# Patient Record
Sex: Female | Born: 1975 | Hispanic: Yes | Marital: Single | Smoking: Never smoker
Health system: Southern US, Community
[De-identification: ages and names within clinical notes are randomized; demographics above are authoritative.]

## PROBLEM LIST (undated history)

## (undated) DIAGNOSIS — Z972 Presence of dental prosthetic device (complete) (partial): Secondary | ICD-10-CM

---

## 2004-10-13 ENCOUNTER — Ambulatory Visit: Payer: Self-pay | Admitting: Family Medicine

## 2005-01-02 ENCOUNTER — Ambulatory Visit: Payer: Self-pay | Admitting: Family Medicine

## 2005-03-10 ENCOUNTER — Inpatient Hospital Stay: Payer: Self-pay | Admitting: Obstetrics and Gynecology

## 2008-07-01 ENCOUNTER — Ambulatory Visit: Payer: Self-pay | Admitting: Certified Nurse Midwife

## 2014-07-07 DIAGNOSIS — N879 Dysplasia of cervix uteri, unspecified: Secondary | ICD-10-CM | POA: Insufficient documentation

## 2019-06-11 ENCOUNTER — Other Ambulatory Visit: Payer: Self-pay

## 2019-06-11 ENCOUNTER — Ambulatory Visit: Payer: Self-pay

## 2019-06-11 VITALS — BP 118/86 | HR 85 | Resp 16 | Ht 62.0 in | Wt 138.0 lb

## 2019-06-11 DIAGNOSIS — Z008 Encounter for other general examination: Secondary | ICD-10-CM

## 2019-06-11 LAB — POCT LIPID PANEL
HDL: 55
LDL: 1.7
Non-HDL: 115
POC Glucose: 107 mg/dl — AB (ref 70–99)
TC/HDL: 3.1
TC: 170
TRG: 114

## 2019-06-11 NOTE — Progress Notes (Signed)
     Patient ID: Phyllis Torres, female    DOB: 09-11-1976, 43 y.o.   MRN: [id number]    Thank you!!  [place] Nurse Specialist [place]: [phone]  Cell:  [phone] Website: [email or web address]

## 2019-12-03 ENCOUNTER — Ambulatory Visit: Payer: Self-pay | Attending: Internal Medicine

## 2019-12-03 DIAGNOSIS — Z23 Encounter for immunization: Secondary | ICD-10-CM

## 2019-12-03 NOTE — Progress Notes (Signed)
   Covid-19 Vaccination Clinic  Name:  Kissey Chou    MRN: [id number] DOB: 03-06-1976  12/03/2019  Ms. Jerilee Hoh was observed post Covid-19 immunization for 15 minutes without incident. She was provided with Vaccine Information Sheet and instruction to access the V-Safe system.   Ms. Jerilee Hoh was instructed to call 911 with any severe reactions post vaccine: Marland Kitchen Difficulty breathing  . Swelling of face and throat  . A fast heartbeat  . A bad rash all over body  . Dizziness and weakness   Immunizations Administered    Name Date Dose VIS Date Route   Pfizer COVID-19 Vaccine 12/03/2019 10:32 AM 0.3 mL 08/28/2019 Intramuscular   Manufacturer: [place]   Lot: [id number]   [place]: [id number]

## 2019-12-29 ENCOUNTER — Ambulatory Visit: Payer: Self-pay | Attending: Internal Medicine

## 2019-12-29 DIAGNOSIS — Z23 Encounter for immunization: Secondary | ICD-10-CM

## 2019-12-29 NOTE — Progress Notes (Signed)
   Covid-19 Vaccination Clinic  Name:  Phyllis Torres    MRN: [id number] DOB: 1975/10/06  12/29/2019  Ms. Phyllis Torres was observed post Covid-19 immunization for 15 minutes without incident. She was provided with Vaccine Information Sheet and instruction to access the V-Safe system.   Ms. Phyllis Torres was instructed to call 911 with any severe reactions post vaccine: Marland Kitchen Difficulty breathing  . Swelling of face and throat  . A fast heartbeat  . A bad rash all over body  . Dizziness and weakness   Immunizations Administered    Name Date Dose VIS Date Route   Pfizer COVID-19 Vaccine 12/29/2019 10:38 AM 0.3 mL 08/28/2019 Intramuscular   Manufacturer: [place]   Lot: [id number]   [place]: [id number]

## 2020-02-27 ENCOUNTER — Other Ambulatory Visit: Payer: Self-pay | Admitting: Physician Assistant

## 2020-02-27 DIAGNOSIS — Z3009 Encounter for other general counseling and advice on contraception: Secondary | ICD-10-CM

## 2021-04-10 ENCOUNTER — Other Ambulatory Visit: Payer: Self-pay | Admitting: Family Medicine

## 2021-04-10 DIAGNOSIS — Z1231 Encounter for screening mammogram for malignant neoplasm of breast: Secondary | ICD-10-CM

## 2021-07-17 ENCOUNTER — Other Ambulatory Visit: Payer: Self-pay

## 2021-07-17 ENCOUNTER — Ambulatory Visit
Admission: RE | Admit: 2021-07-17 | Discharge: 2021-07-17 | Disposition: A | Discharge status: Home / Self Care | Payer: BC Managed Care – PPO | Source: Ambulatory Visit | Attending: Family Medicine | Admitting: Family Medicine

## 2021-07-17 DIAGNOSIS — Z1231 Encounter for screening mammogram for malignant neoplasm of breast: Secondary | ICD-10-CM | POA: Diagnosis not present

## 2022-03-12 ENCOUNTER — Other Ambulatory Visit: Payer: Self-pay | Admitting: Obstetrics and Gynecology

## 2022-03-23 NOTE — H&P (Signed)
Phyllis Torres is a 46 y.o. female here for [facility] and bilateral salpingectomy  .   Here for follow up for menorrhagia for the past 3 months . On OCP which helps some , but she doesn't want to continue the pill .  U/S shows U/s shows 5x4 cm posterior uterine fibroid . And a simple 3 cm right cm ovarian cyst  She desires to proceed with definitive surgery    Past Medical History:  has no past medical history on file.  Past Surgical History:  has no past surgical history on file. Family History: family history includes Diabetes in her father, mother, and sister; Hyperlipidemia (Elevated cholesterol) in her brother. Social History:  reports that she has never smoked. She has never used smokeless tobacco. She reports that she does not currently use alcohol. She reports that she does not use drugs. OB/GYN History:  OB History       Gravida  3   Para  3   Term      Preterm      AB      Living  3        SAB      IAB      Ectopic      Molar      Multiple      Live Births  3             Allergies: is allergic to phentermine. Medications:   Current Outpatient Medications:    etonogestreL (NEXPLANON) 68 mg implant, Inject 1 each into the skin once, Disp: , Rfl:    norgestimate-ethinyl estradiol triphasic (ORTHO TRI-CYCLEN LO) 0.18/0.215/0.25 mg-25 mcg tablet, Take 1 tablet by mouth once daily, Disp: 28 tablet, Rfl: 3   Review of Systems: General:                      No fatigue or weight loss Eyes:                           No vision changes Ears:                            No hearing difficulty Respiratory:                No cough or shortness of breath Pulmonary:                  No asthma or shortness of breath Cardiovascular:           No chest pain, palpitations, dyspnea on exertion Gastrointestinal:          No abdominal bloating, chronic diarrhea, constipations, masses, pain or hematochezia Genitourinary:             No hematuria, dysuria, abnormal vaginal  discharge, pelvic pain, +Menometrorrhagia Lymphatic:                   No swollen lymph nodes Musculoskeletal:         No muscle weakness Neurologic:                  No extremity weakness, syncope, seizure disorder Psychiatric:                  No history of depression, delusions or suicidal/homicidal ideation      Exam:       Vitals:  03/26/22  BP: 137/77  Pulse: 94      Body mass index is 27.8 kg/m.   WDWN white/ female in NAD   Lungs: CTA  CV : RRR without murmur   Neck:  no thyromegaly Abdomen: soft , no mass, normal active bowel sounds,  non-tender, no rebound tenderness Pelvic: tanner stage 5 ,  External genitalia: vulva /labia no lesions Urethra: no prolapse Vagina: normal physiologic d/c adequate room for TVH  Cervix: no lesions, no cervical motion tenderness   Uterus: second degree descensus   9 week prominent posterior  normal size shape and contour, non-tender adnexa: no mass,  non-tender Impression:    The primary encounter diagnosis was Abnormal vaginal bleeding. Diagnoses of Intramural leiomyoma of uterus and Right ovarian cyst were also pertinent to this visit.       Plan:    After discussion with pt and translator ( "g") she has opted for definitive surgery . TVH and bilateral salpingectomy . Information sheets ( spanish ) given to pt       Risk of the surgery discussed with Pt see [facility] notes   Emet Rafanan Samuel [place], MD

## 2022-03-28 ENCOUNTER — Encounter: Payer: Self-pay | Admitting: *Deleted

## 2022-03-28 ENCOUNTER — Encounter
Admission: RE | Admit: 2022-03-28 | Discharge: 2022-03-28 | Disposition: A | Discharge status: Home / Self Care | Payer: BC Managed Care – PPO | Source: Ambulatory Visit | Attending: Obstetrics and Gynecology | Admitting: Obstetrics and Gynecology

## 2022-03-28 VITALS — Ht 62.0 in | Wt 152.0 lb

## 2022-03-28 DIAGNOSIS — Z01812 Encounter for preprocedural laboratory examination: Secondary | ICD-10-CM

## 2022-03-28 NOTE — Patient Instructions (Addendum)
Your procedure is scheduled on: Friday April 06, 2022. Su procedimiento est programado para: Viernes [place] 2023. Report to Day Surgery inside [facility] 2nd floor, stop by admissions desk before getting on elevator.  Presntese a: Science writer del [facility] 2ndo piso, registrese primero antes de subir al [employer]. To find out your arrival time please call [phone] between 1PM - 3PM on Thursday April 05, 2022. Para saber su hora de llegada por favor llame al [phone] Lyndal Pulley la 1PM - 3PM el da: Rudi Coco [address] [place] 2023.   Remember: Instructions that are not followed completely may result in serious medical risk, up to and including death,  or upon the discretion of your surgeon and anesthesiologist your surgery may need to be rescheduled.  Recuerde: Las instrucciones que no se siguen completamente Heritage manager en un riesgo de salud grave, incluyendo hasta  la [place] o a discrecin de su cirujano y Environmental health practitioner, su ciruga se puede posponer.   __X_ 1.Do not eat food after midnight the night before your procedure. No    gum chewing or hard candies. You may drink clear liquids up to 2 hours     before you are scheduled to arrive for your surgery- DO not drink clear     Liquids within 2 hours of the start of your surgery.     Clear Liquids include: Pre-surgery drink   water, Black Coffee or Tea (Do not add anything to coffee or tea).      No coma nada despus de la medianoche de la noche anterior a su    procedimiento. No coma chicles ni caramelos duros. Puede tomar    lquidos claros hasta 2 horas antes de su hora programada de llegada al     hospital para su procedimiento. No tome lquidos claros durante el     transcurso de las 2 horas de su llegada programada al hospital para su     procedimiento, ya que esto puede llevar a que su procedimiento se    retrase o tenga que volver a Health and safety inspector.  Los lquidos claros incluyen: [place] de pre-cirugia            - [place] o jugo de manzana sin pulpa          - Bebidas claras con carbohidratos como ClearFast o Gatorade          - Caf negro o t claro (sin leche, sin cremas, no agregue nada al caf ni al t)  No tome nada que no est en esta lista.  Los pacientes con diabetes tipo 1 y tipo 2 solo deben Agricultural engineer.  Llame a la clnica de PreCare o a la unidad de Same Day Surgery si  tiene alguna pregunta sobre estas instrucciones.              _X__ 2.Do Not Smoke or use e-cigarettes For 24 Hours Prior to Your Surgery.    Do not use any chewable tobacco products for at least 6   hours prior to surgery.    No fume ni use cigarrillos electrnicos durante las 24 horas previas    a su [place].  No use ningn producto de tabaco masticable durante   al menos 6 horas antes de la [place].     __X_ 3. No alcohol for 24 hours before or after surgery.    No tome alcohol durante las 24 horas antes ni despus de la [place].   __X__4. On the  morning of surgery brush your teeth with toothpaste and water, you                may rinse your mouth with mouthwash if you wish.  Do not swallow any toothpaste of mouthwash.   En la maana de la [place], cepllese los dientes con pasta de dientes y [place],                [place] enjuagarse la boca con enjuague bucal si lo desea. No ingiera ninguna pasta de dientes o enjuague bucal.   __X__ 5. Notify your doctor if there is any change in your medical condition (cold,fever, infections).    Informe a su mdico si hay algn cambio en su condicin mdica  (resfriado, fiebre, infecciones).   Do not wear jewelry, make-up, hairpins, clips or nail polish.  No use joyas, maquillajes, pinzas/ganchos para el cabello ni esmalte de uas.  Do not wear lotions, powders, or perfumes. You may wear deodorant.  No use lociones, polvos o perfumes.  Puede usar desodorante.    Do not shave 48 hours prior to surgery. Men may shave face and neck.  No se afeite 48 horas antes de la [place].   Los hombres pueden [employer] cara  y el cuello.   Do not bring valuables to the hospital.   No lleve objetos [employer] is not responsible for any belongings or valuables.  [place] no se hace responsable de ningn tipo de pertenencias u objetos de Geographical information systems officer.               Contacts, dentures or bridgework may not be worn into surgery.  Los lentes de [place], las dentaduras postizas o puentes no se pueden usar en la [place].   Leave your suitcase in the car. After surgery it may be brought to your room.  Deje su maleta en el auto.  Despus de la ciruga podr traerla a su habitacin.   For patients admitted to the hospital, discharge time is determined by your  treatment team.  Para los pacientes que sean ingresados al hospital, el tiempo en el cual se le  dar de alta es determinado por su equipo de [place].   Patients discharged the day of surgery will not be allowed to drive home. A los pacientes que se les da de alta el mismo da de la ciruga no se les permitir conducir a Holiday representative.  __X__ Take these medicines the morning of surgery with A SIP OF WATER:          [employer] estas medicinas la maana de la ciruga con UN SORBO DE AGUA:  1. None   2.   3.   4.       5.  6.  ____ Fleet Enema (as directed)          Enema de Fleet (segn lo indicado)    __X__ Use CHG Soap as directed          Utilice el jabn de CHG segn lo indicado  ____ Use inhalers on the day of surgery          Use los inhaladores el da de la ciruga  ____ Stop metformin 2 days prior to surgery          Deje de tomar el metformin 2 das antes de la ciruga    ____ Take 1/2 of usual insulin dose the night before surgery and none on the morning of surgery  Tome la mitad de la dosis habitual de insulina la noche antes de la [place] y no tome nada en la maana de la             ciruga  __X__ Stop Anti-inflammatories such as Ibuprofen, Aleve, Advil, Motrin, naprosyn,  Meloxicam, Lodine, Ketoralac, Midol, aspirin, Goody's and or BC powders.  Tylenol is OK          Deje de tomar antiinflamatorios como Ibuprofen, aleve, Advil, Motrin, naprosyn, Meloxicam, Lodine, Ketoralac, Midol, aspirin, Goody's and or BC powders. Tylenol is OK   __X__ Do not start any new vitamins and or supplements until after surgery            No empize a tomar vitaminas o suplementos hasta despus de la ciruga  ____ Bring C-Pap to the hospital          [place] al hospital

## 2022-03-30 ENCOUNTER — Encounter
Admission: RE | Admit: 2022-03-30 | Discharge: 2022-03-30 | Disposition: A | Discharge status: Home / Self Care | Payer: BC Managed Care – PPO | Source: Ambulatory Visit | Attending: Obstetrics and Gynecology | Admitting: Obstetrics and Gynecology

## 2022-03-30 DIAGNOSIS — Z01818 Encounter for other preprocedural examination: Secondary | ICD-10-CM

## 2022-03-30 DIAGNOSIS — Z01812 Encounter for preprocedural laboratory examination: Secondary | ICD-10-CM | POA: Insufficient documentation

## 2022-03-30 LAB — CBC
HCT: 39.6 % (ref 36.0–46.0)
Hemoglobin: 12.9 g/dL (ref 12.0–15.0)
MCH: 30.1 pg (ref 26.0–34.0)
MCHC: 32.6 g/dL (ref 30.0–36.0)
MCV: 92.5 fL (ref 80.0–100.0)
Platelets: 328 10*3/uL (ref 150–400)
RBC: 4.28 MIL/uL (ref 3.87–5.11)
RDW: 12.3 % (ref 11.5–15.5)
WBC: 7.6 10*3/uL (ref 4.0–10.5)
nRBC: 0 % (ref 0.0–0.2)

## 2022-03-30 LAB — BASIC METABOLIC PANEL
Anion gap: 4 — ABNORMAL LOW (ref 5–15)
BUN: 12 mg/dL (ref 6–20)
CO2: 27 mmol/L (ref 22–32)
Calcium: 8.9 mg/dL (ref 8.9–10.3)
Chloride: 106 mmol/L (ref 98–111)
Creatinine, Ser: 0.68 mg/dL (ref 0.44–1.00)
GFR, Estimated: >60 mL/min (ref >60)
Glucose, Bld: 133 mg/dL — ABNORMAL HIGH (ref 70–99)
Potassium: 4.1 mmol/L (ref 3.5–5.1)
Sodium: 137 mmol/L (ref 135–145)

## 2022-04-02 LAB — TYPE AND SCREEN
ABO/RH(D): O POS
Antibody Screen: NEGATIVE

## 2022-04-06 ENCOUNTER — Ambulatory Visit
Admission: RE | Admit: 2022-04-06 | Discharge: 2022-04-06 | Disposition: A | Discharge status: Home / Self Care | Payer: BC Managed Care – PPO | Source: Ambulatory Visit | Attending: Obstetrics and Gynecology | Admitting: Obstetrics and Gynecology

## 2022-04-06 ENCOUNTER — Encounter: Payer: Self-pay | Admitting: Obstetrics and Gynecology

## 2022-04-06 ENCOUNTER — Other Ambulatory Visit: Payer: Self-pay

## 2022-04-06 ENCOUNTER — Ambulatory Visit: Payer: BC Managed Care – PPO | Admitting: Urgent Care

## 2022-04-06 ENCOUNTER — Ambulatory Visit: Payer: BC Managed Care – PPO | Admitting: General Practice

## 2022-04-06 ENCOUNTER — Encounter
Admission: RE | Disposition: A | Discharge status: Home / Self Care | Payer: Self-pay | Source: Ambulatory Visit | Attending: Obstetrics and Gynecology

## 2022-04-06 DIAGNOSIS — N83201 Unspecified ovarian cyst, right side: Secondary | ICD-10-CM | POA: Diagnosis not present

## 2022-04-06 DIAGNOSIS — N72 Inflammatory disease of cervix uteri: Secondary | ICD-10-CM | POA: Diagnosis not present

## 2022-04-06 DIAGNOSIS — N920 Excessive and frequent menstruation with regular cycle: Secondary | ICD-10-CM | POA: Insufficient documentation

## 2022-04-06 DIAGNOSIS — N8003 Adenomyosis of the uterus: Secondary | ICD-10-CM | POA: Diagnosis not present

## 2022-04-06 DIAGNOSIS — D251 Intramural leiomyoma of uterus: Secondary | ICD-10-CM | POA: Diagnosis not present

## 2022-04-06 DIAGNOSIS — Z01812 Encounter for preprocedural laboratory examination: Secondary | ICD-10-CM

## 2022-04-06 DIAGNOSIS — Z01818 Encounter for other preprocedural examination: Secondary | ICD-10-CM

## 2022-04-06 HISTORY — PX: VAGINAL HYSTERECTOMY: SHX2639

## 2022-04-06 HISTORY — PX: BILATERAL SALPINGECTOMY: SHX5743

## 2022-04-06 LAB — POCT PREGNANCY, URINE: Preg Test, Ur: NEGATIVE

## 2022-04-06 LAB — ABO/RH: ABO/RH(D): O POS

## 2022-04-06 SURGERY — HYSTERECTOMY, VAGINAL
Anesthesia: General | Site: Vagina

## 2022-04-06 MED ORDER — GABAPENTIN 300 MG PO CAPS
300.0000 mg | ORAL_CAPSULE | ORAL | Status: AC
Start: 2022-04-06 — End: 2022-04-06

## 2022-04-06 MED ORDER — OXYCODONE HCL 5 MG/5ML PO SOLN: 5.0000 mg | Freq: Once | ORAL | Status: AC | PRN

## 2022-04-06 MED ORDER — LIDOCAINE HCL (PF) 2 % IJ SOLN
INTRAMUSCULAR | Status: AC
  Filled 2022-04-06: qty 5

## 2022-04-06 MED ORDER — ACETAMINOPHEN 500 MG PO TABS: 1000.0000 mg | ORAL_TABLET | ORAL | Status: AC

## 2022-04-06 MED ORDER — ONDANSETRON HCL 4 MG/2ML IJ SOLN
INTRAMUSCULAR | Status: AC
  Administered 2022-04-06: 4 mg via INTRAVENOUS
  Filled 2022-04-06: qty 2

## 2022-04-06 MED ORDER — ROCURONIUM BROMIDE 10 MG/ML (PF) SYRINGE
PREFILLED_SYRINGE | INTRAVENOUS | Status: AC
Start: 2022-04-06 — End: ?
  Filled 2022-04-06: qty 10

## 2022-04-06 MED ORDER — ONDANSETRON HCL 4 MG/2ML IJ SOLN
INTRAMUSCULAR | Status: AC
  Filled 2022-04-06: qty 2

## 2022-04-06 MED ORDER — SUGAMMADEX SODIUM 200 MG/2ML IV SOLN
INTRAVENOUS | Status: DC | PRN
  Administered 2022-04-06: 200 mg via INTRAVENOUS

## 2022-04-06 MED ORDER — KETOROLAC TROMETHAMINE 30 MG/ML IJ SOLN
INTRAMUSCULAR | Status: AC
Start: 2022-04-06 — End: ?
  Filled 2022-04-06: qty 1

## 2022-04-06 MED ORDER — DEXAMETHASONE SODIUM PHOSPHATE 10 MG/ML IJ SOLN
INTRAMUSCULAR | Status: DC | PRN
  Administered 2022-04-06: 10 mg via INTRAVENOUS

## 2022-04-06 MED ORDER — CEFAZOLIN SODIUM-DEXTROSE 2-4 GM/100ML-% IV SOLN
INTRAVENOUS | Status: AC
  Filled 2022-04-06: qty 100

## 2022-04-06 MED ORDER — ONDANSETRON 4 MG PO TBDP: 4.0000 mg | ORAL_TABLET | Freq: Four times a day (QID) | ORAL | Status: DC | PRN

## 2022-04-06 MED ORDER — PHENYLEPHRINE 80 MCG/ML (10ML) SYRINGE FOR IV PUSH (FOR BLOOD PRESSURE SUPPORT)
PREFILLED_SYRINGE | INTRAVENOUS | Status: DC | PRN
  Administered 2022-04-06 (×2): 80 ug via INTRAVENOUS

## 2022-04-06 MED ORDER — CEFAZOLIN SODIUM-DEXTROSE 2-4 GM/100ML-% IV SOLN
2.0000 g | Freq: Once | INTRAVENOUS | Status: AC
  Administered 2022-04-06: 2 g via INTRAVENOUS

## 2022-04-06 MED ORDER — FENTANYL CITRATE (PF) 250 MCG/5ML IJ SOLN
INTRAMUSCULAR | Status: AC
  Filled 2022-04-06: qty 5

## 2022-04-06 MED ORDER — FAMOTIDINE 20 MG PO TABS: 20.0000 mg | ORAL_TABLET | Freq: Once | ORAL | Status: AC

## 2022-04-06 MED ORDER — LIDOCAINE-EPINEPHRINE 1 %-1:100000 IJ SOLN
INTRAMUSCULAR | Status: AC
  Filled 2022-04-06: qty 1

## 2022-04-06 MED ORDER — KETOROLAC TROMETHAMINE 30 MG/ML IJ SOLN
INTRAMUSCULAR | Status: AC
  Filled 2022-04-06: qty 1

## 2022-04-06 MED ORDER — DEXAMETHASONE SODIUM PHOSPHATE 10 MG/ML IJ SOLN
INTRAMUSCULAR | Status: AC
  Filled 2022-04-06: qty 1

## 2022-04-06 MED ORDER — 0.9 % SODIUM CHLORIDE (POUR BTL) OPTIME
TOPICAL | Status: DC | PRN
  Administered 2022-04-06: 1000 mL

## 2022-04-06 MED ORDER — ONDANSETRON HCL 4 MG/2ML IJ SOLN
INTRAMUSCULAR | Status: DC | PRN
  Administered 2022-04-06: 4 mg via INTRAVENOUS

## 2022-04-06 MED ORDER — POVIDONE-IODINE 10 % EX SWAB
2.0000 | Freq: Once | CUTANEOUS | Status: AC
  Administered 2022-04-06: 2 via TOPICAL

## 2022-04-06 MED ORDER — FAMOTIDINE 20 MG PO TABS
ORAL_TABLET | ORAL | Status: AC
  Administered 2022-04-06: 20 mg via ORAL
  Filled 2022-04-06: qty 1

## 2022-04-06 MED ORDER — OXYCODONE HCL 5 MG PO TABS: 5.0000 mg | ORAL_TABLET | ORAL | Status: DC | PRN

## 2022-04-06 MED ORDER — OXYCODONE HCL 5 MG PO TABS: 5.0000 mg | ORAL_TABLET | Freq: Once | ORAL | Status: AC | PRN

## 2022-04-06 MED ORDER — LACTATED RINGERS IV SOLN: INTRAVENOUS | Status: DC

## 2022-04-06 MED ORDER — PHENYLEPHRINE 80 MCG/ML (10ML) SYRINGE FOR IV PUSH (FOR BLOOD PRESSURE SUPPORT)
PREFILLED_SYRINGE | INTRAVENOUS | Status: AC
  Filled 2022-04-06: qty 10

## 2022-04-06 MED ORDER — OXYCODONE HCL 5 MG PO TABS
ORAL_TABLET | ORAL | Status: AC
  Administered 2022-04-06: 5 mg via ORAL
  Filled 2022-04-06: qty 1

## 2022-04-06 MED ORDER — LIDOCAINE-EPINEPHRINE 1 %-1:100000 IJ SOLN
INTRAMUSCULAR | Status: DC | PRN
  Administered 2022-04-06: 12 mL

## 2022-04-06 MED ORDER — ACETAMINOPHEN 500 MG PO TABS
ORAL_TABLET | ORAL | Status: AC
  Administered 2022-04-06: 1000 mg via ORAL
  Filled 2022-04-06: qty 2

## 2022-04-06 MED ORDER — PROPOFOL 10 MG/ML IV BOLUS
INTRAVENOUS | Status: AC
  Filled 2022-04-06: qty 40

## 2022-04-06 MED ORDER — CHLORHEXIDINE GLUCONATE 0.12 % MT SOLN: 15.0000 mL | Freq: Once | OROMUCOSAL | Status: AC

## 2022-04-06 MED ORDER — ROCURONIUM BROMIDE 100 MG/10ML IV SOLN
INTRAVENOUS | Status: DC | PRN
  Administered 2022-04-06: 50 mg via INTRAVENOUS

## 2022-04-06 MED ORDER — FENTANYL CITRATE (PF) 100 MCG/2ML IJ SOLN
25.0000 ug | INTRAMUSCULAR | Status: DC | PRN
  Administered 2022-04-06 (×2): 25 ug via INTRAVENOUS

## 2022-04-06 MED ORDER — GABAPENTIN 300 MG PO CAPS
ORAL_CAPSULE | ORAL | Status: AC
  Administered 2022-04-06: 300 mg via ORAL
  Filled 2022-04-06: qty 1

## 2022-04-06 MED ORDER — CHLORHEXIDINE GLUCONATE 0.12 % MT SOLN
OROMUCOSAL | Status: AC
  Administered 2022-04-06: 15 mL via OROMUCOSAL
  Filled 2022-04-06: qty 15

## 2022-04-06 MED ORDER — FENTANYL CITRATE (PF) 100 MCG/2ML IJ SOLN
INTRAMUSCULAR | Status: DC | PRN
  Administered 2022-04-06: 100 ug via INTRAVENOUS

## 2022-04-06 MED ORDER — MIDAZOLAM HCL 2 MG/2ML IJ SOLN
INTRAMUSCULAR | Status: AC
  Filled 2022-04-06: qty 2

## 2022-04-06 MED ORDER — ONDANSETRON HCL 4 MG/2ML IJ SOLN: 4.0000 mg | Freq: Once | INTRAMUSCULAR | Status: AC

## 2022-04-06 MED ORDER — ONDANSETRON HCL 4 MG/2ML IJ SOLN
INTRAMUSCULAR | Status: AC
Start: 2022-04-06 — End: ?
  Filled 2022-04-06: qty 2

## 2022-04-06 MED ORDER — PROPOFOL 10 MG/ML IV BOLUS
INTRAVENOUS | Status: DC | PRN
  Administered 2022-04-06: 120 mg via INTRAVENOUS

## 2022-04-06 MED ORDER — FENTANYL CITRATE (PF) 100 MCG/2ML IJ SOLN
INTRAMUSCULAR | Status: AC
  Administered 2022-04-06: 25 ug via INTRAVENOUS
  Filled 2022-04-06: qty 2

## 2022-04-06 MED ORDER — ORAL CARE MOUTH RINSE: 15.0000 mL | Freq: Once | OROMUCOSAL | Status: AC

## 2022-04-06 MED ORDER — KETOROLAC TROMETHAMINE 30 MG/ML IJ SOLN
INTRAMUSCULAR | Status: DC | PRN
  Administered 2022-04-06: 30 mg via INTRAVENOUS

## 2022-04-06 MED ORDER — MIDAZOLAM HCL 2 MG/2ML IJ SOLN
INTRAMUSCULAR | Status: DC | PRN
  Administered 2022-04-06: 2 mg via INTRAVENOUS

## 2022-04-06 MED ORDER — LIDOCAINE HCL (CARDIAC) PF 100 MG/5ML IV SOSY
PREFILLED_SYRINGE | INTRAVENOUS | Status: DC | PRN
  Administered 2022-04-06: 80 mg via INTRAVENOUS

## 2022-04-06 SURGICAL SUPPLY — 42 items
BAG DRN RND TRDRP ANRFLXCHMBR (UROLOGICAL SUPPLIES) ×2
BAG URINE DRAIN 2000ML AR STRL (UROLOGICAL SUPPLIES) ×3 IMPLANT
BASIN GRAD PLASTIC 32OZ STRL (MISCELLANEOUS) ×1 IMPLANT
CATH FOLEY 2WAY  5CC 16FR (CATHETERS) ×1
CATH FOLEY 2WAY 5CC 16FR (CATHETERS) ×2
CATH ROBINSON RED A/P 16FR (CATHETERS) ×3 IMPLANT
CATH URTH 16FR FL 2W BLN LF (CATHETERS) ×2 IMPLANT
DRAPE PERI LITHO V/GYN (MISCELLANEOUS) ×3 IMPLANT
DRAPE SURG 17X11 SM STRL (DRAPES) ×3 IMPLANT
DRAPE UNDER BUTTOCK W/FLU (DRAPES) ×3 IMPLANT
ELECT REM PT RETURN 9FT ADLT (ELECTROSURGICAL) ×3
ELECTRODE REM PT RTRN 9FT ADLT (ELECTROSURGICAL) ×2 IMPLANT
GAUZE 4X4 16PLY ~~LOC~~+RFID DBL (SPONGE) ×6 IMPLANT
GLOVE SURG SYN 8.0 (GLOVE) ×3 IMPLANT
GLOVE SURG SYN 8.0 PF PI (GLOVE) ×2 IMPLANT
GOWN STRL REUS W/ TWL LRG LVL3 (GOWN DISPOSABLE) ×6 IMPLANT
GOWN STRL REUS W/ TWL XL LVL3 (GOWN DISPOSABLE) ×2 IMPLANT
GOWN STRL REUS W/TWL LRG LVL3 (GOWN DISPOSABLE) ×9
GOWN STRL REUS W/TWL XL LVL3 (GOWN DISPOSABLE) ×3
KIT TURNOVER CYSTO (KITS) ×3 IMPLANT
LABEL OR SOLS (LABEL) ×3 IMPLANT
MANIFOLD NEPTUNE II (INSTRUMENTS) ×3 IMPLANT
NEEDLE HYPO 22GX1.5 SAFETY (NEEDLE) ×3 IMPLANT
PACK BASIN MINOR ARMC (MISCELLANEOUS) ×3 IMPLANT
PAD OB MATERNITY 4.3X12.25 (PERSONAL CARE ITEMS) ×3 IMPLANT
PAD PREP 24X41 OB/GYN DISP (PERSONAL CARE ITEMS) ×3 IMPLANT
SCRUB CHG 4% DYNA-HEX 4OZ (MISCELLANEOUS) ×3 IMPLANT
SOL PREP PVP 2OZ (MISCELLANEOUS) ×3
SOL SCRUB PVP POV-IOD 4OZ 7.5% (MISCELLANEOUS) ×3
SOLUTION PREP PVP 2OZ (MISCELLANEOUS) ×2 IMPLANT
SOLUTION SCRB POV-IOD 4OZ 7.5% (MISCELLANEOUS) ×2 IMPLANT
SURGILUBE 2OZ TUBE FLIPTOP (MISCELLANEOUS) ×3 IMPLANT
SUT PDS 2-0 27IN (SUTURE) IMPLANT
SUT VIC AB 0 CT1 27 (SUTURE) ×9
SUT VIC AB 0 CT1 27XCR 8 STRN (SUTURE) ×4 IMPLANT
SUT VIC AB 0 CT1 36 (SUTURE) ×3 IMPLANT
SUT VIC AB 2-0 SH 27 (SUTURE) ×3
SUT VIC AB 2-0 SH 27XBRD (SUTURE) ×2 IMPLANT
SYR 10ML LL (SYRINGE) ×3 IMPLANT
SYR CONTROL 10ML LL (SYRINGE) ×3 IMPLANT
WATER STERILE IRR 1000ML POUR (IV SOLUTION) ×3 IMPLANT
WATER STERILE IRR 500ML POUR (IV SOLUTION) ×3 IMPLANT

## 2022-04-06 NOTE — Discharge Instructions (Addendum)
AMBULATORY SURGERY  DISCHARGE INSTRUCTIONS   The drugs that you were given will stay in your system until tomorrow so for the next 24 hours you should not:  Drive an automobile Make any legal decisions Drink any alcoholic beverage   You may resume regular meals tomorrow.  Today it is better to start with liquids and gradually work up to solid foods.  You may eat anything you prefer, but it is better to start with liquids, then soup and crackers, and gradually work up to solid foods.   Please notify your doctor immediately if you have any unusual bleeding, trouble breathing, redness and pain at the surgery site, drainage, fever, or pain not relieved by medication.    Additional Instructions:    Please contact your physician with any problems or Same Day Surgery at [phone], Monday through Friday 6 am to 4 pm, or [place] at [facility] number at [phone].          CIRUGIA AMBULATORIA          Instruccionnes de alta    Date (Fecha)   1.  Las drogas que se Statistician en su cuerpo [employer], asi que por las proximas 24 horas usted no debe:   Conducir Scientist, research (medical)) un automovil   Hacer ninguna decision legal   Tomar ninguna bebida alcoholica  2.  A) [place] puede comenzar una dieta regular.  Es mejor que hoy empiece con liquidos y gradualmente anada comidas solidas.       B) Puede comer cualquier comida que desee pero es mejor empezar con liquidos, luego sopitas con galletas saladas y gradualmente llegar a las comidas solidas.  3.  Por favor avise a su medico inmediatamente si usted tiene algun sangrado anormal, tiene dificultad con la respiracion, enrojecimiento y Social research officer, government en el sitio de la cirugia, [place], fiebro o dolor que se alivia con [place].  4.  Istrucciones especificas :       Le dieron Toradol a las 8:41am que es parecido al Ibuprofen      Le dieron Tylenol a las 6:44 am y [employer] dieron Oxycodone a las 9:19 am.

## 2022-04-06 NOTE — Anesthesia Procedure Notes (Signed)
Procedure Name: Intubation Date/Time: 04/06/2022 7:36 AM  Performed by: Esaw Grandchild, CRNAPre-anesthesia Checklist: Patient identified, Emergency Drugs available, Suction available and Patient being monitored Patient Re-evaluated:Patient Re-evaluated prior to induction Oxygen Delivery Method: Circle system utilized Preoxygenation: Pre-oxygenation with 100% oxygen Induction Type: IV induction Ventilation: Mask ventilation without difficulty Laryngoscope Size: Miller and 2 Grade View: Grade I Tube type: Oral Tube size: 7.0 mm Number of attempts: 1 Airway Equipment and Method: Stylet, Oral airway and Bite block Placement Confirmation: ETT inserted through vocal cords under direct vision, positive ETCO2 and breath sounds checked- equal and bilateral Secured at: 20 cm Tube secured with: Tape Dental Injury: Teeth and Oropharynx as per pre-operative assessment

## 2022-04-06 NOTE — Op Note (Signed)
Phyllis Torres, STROM MEDICAL RECORD NO: [id number] ACCOUNT NO: [id number] DATE OF BIRTH: [birth year] FACILITY: [facility] LOCATION: [facility] PHYSICIAN: Boykin Nearing, MD  Operative Report   DATE OF PROCEDURE: 04/06/2022  PREOPERATIVE DIAGNOSES:   1.  Menorrhagia, unresponsive to conservative therapy. 2.  Fibroid uterus. 3.  Right ovarian cyst.  POSTOPERATIVE DIAGNOSES:   1.  Menorrhagia, unresponsive to conservative therapy. 2.  Fibroid uterus. 3.  Right ovarian cyst.   PROCEDURES:   1.  Total vaginal hysterectomy, bilateral salpingectomy. 2.  Right ovarian cystotomy.  SURGEON:  Boykin Nearing, MD  ASSISTANT:  Benjaman Kindler, MD  ANESTHESIA:  General endotracheal anesthesia.  INDICATIONS:  A 46 year old gravida 3, para 3, patient with long history of menorrhagia, unresponsive to birth control pills.  The patient was noted to have a 5 cm posterior uterine fibroid as well as a 3 cm right ovarian cyst.  The patient has elected  for definitive therapy.  DESCRIPTION OF PROCEDURE:  After adequate general endotracheal anesthesia, the patient was placed in dorsal supine position with the legs in the candy cane stirrups.  The patient's lower abdomen, perineum and vagina were prepped and draped in normal  sterile fashion.  She did receive 2 grams of IV Ancef prior to commencement for surgical prophylaxis.  Timeout was performed.  Weighted speculum was placed in the posterior vaginal vault and the bladder was drained with a Foley catheter, yielding 150 mL  of urine and the Foley catheter was then removed.  The cervix was grasped with 2 thyroid tenacula and the cervix was circumferentially injected with 1% lidocaine with 1:100,000 epinephrine.  The posterior vaginal fornix was grasped with an Allis clamp  and a direct posterior colpotomy incision was made.  Upon entry into the posterior cul-de-sac the peritoneum was tagged to the vaginal epithelium.  Long billed  speculum was placed.  Uterosacral ligaments were then bilaterally clamped, transected and  suture ligated with 0 Vicryl suture.  A circumferential incision of the cervix anteriorly was performed.  Cardinal ligaments were then bilaterally clamped, transected and suture ligated with 0 Vicryl suture.  The anterior cul-de-sac was then entered  sharply and Deaver retractor was placed within to elevate the bladder anteriorly.  Uterine arteries were then bilaterally clamped, transected and suture ligated with 0 Vicryl suture.  Sequential clamping and suturing to the uterine cornua was  accomplished.  Bilateral cornua were then clamped, transected and doubly ligated with 0 Vicryl suture.  Right ovary was identified with a benign-appearing right ovarian cyst.  A right ovarian cystotomy was performed with clear follicular fluid being  removed.  Each fallopian tube was grasped with a Babcock clamp and the distal portion of the fallopian tube was clamped, transected and ligated with 0 Vicryl suture.  Good hemostasis was noted.  Two additional figure-of-eight sutures required for  hemostasis.  The peritoneum was then closed in a pursestring fashion with 2-0 PDS suture and the vaginal cuff was then closed with a running 0 Vicryl suture in a vertical orientation.  Uterosacral ligaments were plicated centrally and the rest of the  cuff was closed with 0 Vicryl suture.  Good hemostasis was noted.  Bladder was then catheterized again yielding additional 50 mL clear urine.  There were no complications.  ESTIMATED BLOOD LOSS:  25 mL  INTRAOPERATIVE FLUIDS:  700 mL  URINE OUTPUT:  200 mL  The patient did receive 30 mg intravenous Toradol at the end of the case and she was taken to recovery room in  good condition.   PUS D: 04/06/2022 9:21:44 am T: 04/06/2022 9:49:00 am  JOB: [id number]/ [id number]

## 2022-04-06 NOTE — Transfer of Care (Signed)
Immediate Anesthesia Transfer of Care Note  Patient: Phyllis Torres  Procedure(s) Performed: HYSTERECTOMY VAGINAL (Vagina ) BILATERAL SALPINGECTOMY (Bilateral: Vagina )  Patient Location: PACU  Anesthesia Type:General  Level of Consciousness: awake, alert  and oriented  Airway & Oxygen Therapy: Patient Spontanous Breathing and Patient connected to nasal cannula oxygen  Post-op Assessment: Report given to RN, Post -op Vital signs reviewed and stable and Patient moving all extremities  Post vital signs: Reviewed and stable  Last Vitals:  Vitals Value Taken Time  BP 108/71 04/06/22 0900  Temp 36.6 C 04/06/22 0857  Pulse 76 04/06/22 0902  Resp 15 04/06/22 0902  SpO2 95 % 04/06/22 0902  Vitals shown include unvalidated device data.  Last Pain:  Vitals:   04/06/22 0857  TempSrc:   PainSc: 0-No pain         Complications: No notable events documented.

## 2022-04-06 NOTE — Anesthesia Preprocedure Evaluation (Signed)
Anesthesia Evaluation  Patient identified by MRN, date of birth, ID band Patient awake    Reviewed: Allergy & Precautions, NPO status , Patient's Chart, lab work & pertinent test results  Airway Mallampati: II  TM Distance: >3 FB Neck ROM: full    Dental  (+) Teeth Intact   Pulmonary neg pulmonary ROS,    Pulmonary exam normal        Cardiovascular negative cardio ROS Normal cardiovascular exam     Neuro/Psych negative neurological ROS  negative psych ROS   GI/Hepatic negative GI ROS, Neg liver ROS,   Endo/Other  negative endocrine ROS  Renal/GU      Musculoskeletal   Abdominal   Peds  Hematology negative hematology ROS (+)   Anesthesia Other Findings History reviewed. No pertinent past medical history.  History reviewed. No pertinent surgical history.  BMI    Body Mass Index: 27.78 kg/m      Reproductive/Obstetrics negative OB ROS                             Anesthesia Physical Anesthesia Plan  ASA: 2  Anesthesia Plan: General/Spinal   Post-op Pain Management:    Induction: Intravenous  PONV Risk Score and Plan: 3 and Ondansetron, Dexamethasone, Midazolam and Treatment may vary due to age or medical condition  Airway Management Planned: Oral ETT  Additional Equipment:   Intra-op Plan:   Post-operative Plan: Extubation in OR  Informed Consent: I have reviewed the patients History and Physical, chart, labs and discussed the procedure including the risks, benefits and alternatives for the proposed anesthesia with the patient or authorized representative who has indicated his/her understanding and acceptance.     Dental Advisory Given and Interpreter used for interveiw  Plan Discussed with: Anesthesiologist, CRNA and Surgeon  Anesthesia Plan Comments: (Patient consented for risks of anesthesia including but not limited to:  - adverse reactions to medications -  damage to eyes, teeth, lips or other oral mucosa - nerve damage due to positioning  - sore throat or hoarseness - Damage to heart, brain, nerves, lungs, other parts of body or loss of life  Patient voiced understanding.)       Anesthesia Quick Evaluation

## 2022-04-06 NOTE — Progress Notes (Signed)
Pt here for [facility] and bs  Labs reviewed , all questions answered . HCG neg . Proceed .

## 2022-04-06 NOTE — Anesthesia Postprocedure Evaluation (Signed)
Anesthesia Post Note  Patient: Phyllis Torres  Procedure(s) Performed: HYSTERECTOMY VAGINAL (Vagina ) BILATERAL SALPINGECTOMY WITH RIGHT OVARIAN CYSTOTOMY (Bilateral: Vagina )  Patient location during evaluation: PACU Anesthesia Type: Combined General/Spinal Level of consciousness: awake and alert Pain management: pain level controlled Vital Signs Assessment: post-procedure vital signs reviewed and stable Respiratory status: spontaneous breathing, nonlabored ventilation, respiratory function stable and patient connected to nasal cannula oxygen Cardiovascular status: blood pressure returned to baseline and stable Postop Assessment: no apparent nausea or vomiting Anesthetic complications: no   No notable events documented.   Last Vitals:  Vitals:   04/06/22 1008 04/06/22 1131  BP: (!) 107/48 121/79  Pulse: 75 62  Resp: 18 18  Temp: (!) 35.6 C   SpO2: 100% 100%    Last Pain:  Vitals:   04/06/22 1131  TempSrc:   PainSc: 0-No pain                 Dimas Millin

## 2022-04-06 NOTE — Brief Op Note (Signed)
04/06/2022  8:46 AM  PATIENT:  Phyllis Torres  46 y.o. female  PRE-OPERATIVE DIAGNOSIS:  Menorrhagia, fibroid , right ovarian cyst   POST-OPERATIVE DIAGNOSIS:  Menorrhagia, , fibroid , right ovarian cyst   PROCEDURE:  Procedure(s): HYSTERECTOMY VAGINAL (N/A) BILATERAL SALPINGECTOMY (Bilateral) Right ovarian cystotomy  SURGEON:  Surgeon(s) and Role:    * Riku Buttery, Gwen Her, MD - Primary    * Benjaman Kindler, MD - Assisting  PHYSICIAN ASSISTANT: CST  ASSISTANTS: none   ANESTHESIA:   general  EBL:  25 mL IOF 700 cc , uo 200cc  BLOOD ADMINISTERED:none  DRAINS: none   LOCAL MEDICATIONS USED:  LIDOCAINE with epi 12 cc   SPECIMEN:  Source of Specimen:  cervix , uterus and bilateral tube   DISPOSITION OF SPECIMEN:  PATHOLOGY  COUNTS:  YES  TOURNIQUET:  * No tourniquets in log *  DICTATION: .Other Dictation: Dictation Number verbal  PLAN OF CARE: Discharge to home after PACU  PATIENT DISPOSITION:  PACU - hemodynamically stable.   Delay start of Pharmacological VTE agent (>24hrs) due to surgical blood loss or risk of bleeding: not applicable

## 2022-04-09 LAB — SURGICAL PATHOLOGY

## 2023-04-26 IMAGING — MG MM DIGITAL SCREENING BILAT W/ TOMO AND CAD
8 series · 8 of 24 positions shown · non-contrast
Comparison: Previous exam(s).

CLINICAL DATA: Screening.

EXAM:
DIGITAL SCREENING BILATERAL MAMMOGRAM WITH TOMOSYNTHESIS AND CAD
TECHNIQUE: Bilateral screening digital craniocaudal and mediolateral oblique
mammograms were obtained. Bilateral screening digital breast
tomosynthesis was performed. The images were evaluated with
computer-aided detection.

[R MLO synth-2D]
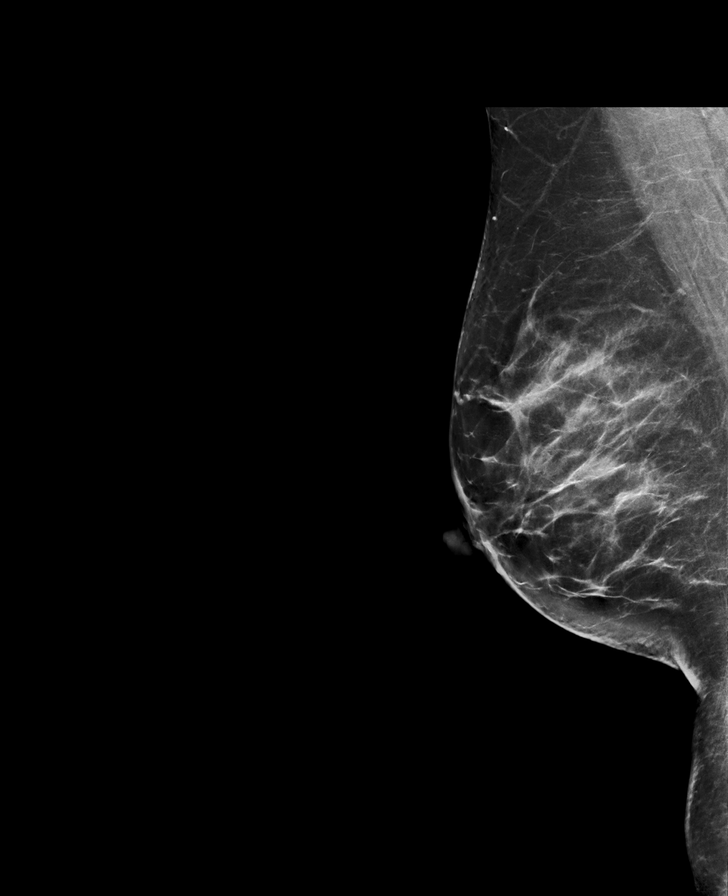

[L CC synth-2D]
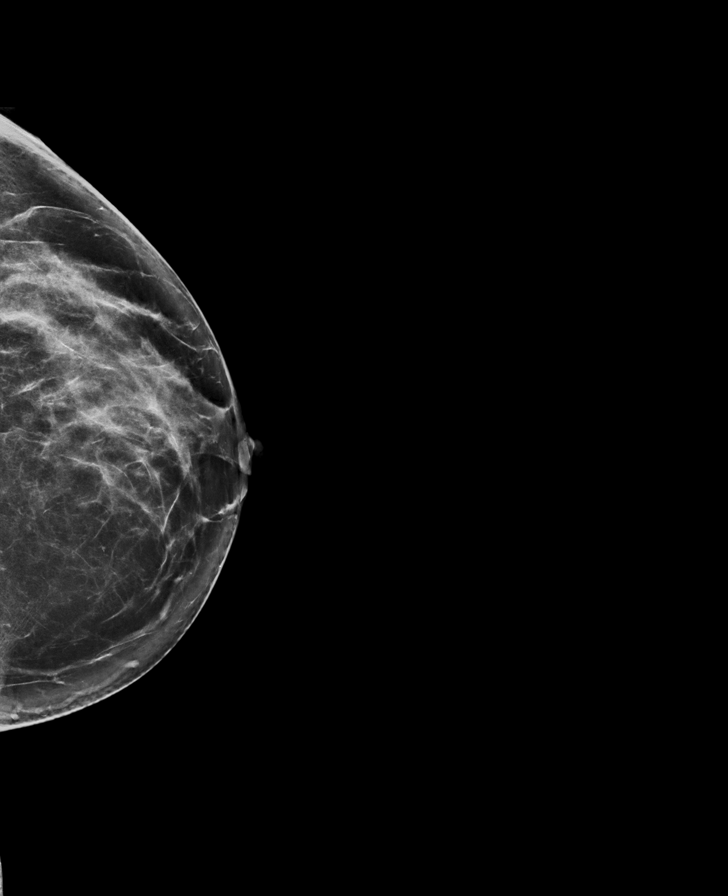

[R CC synth-2D]
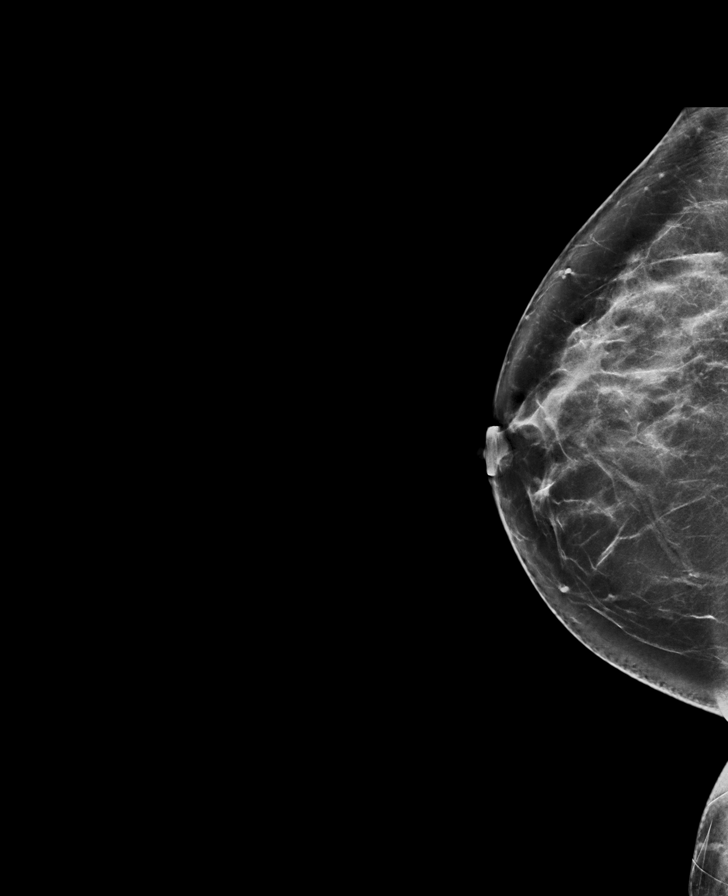

[L MLO synth-2D]
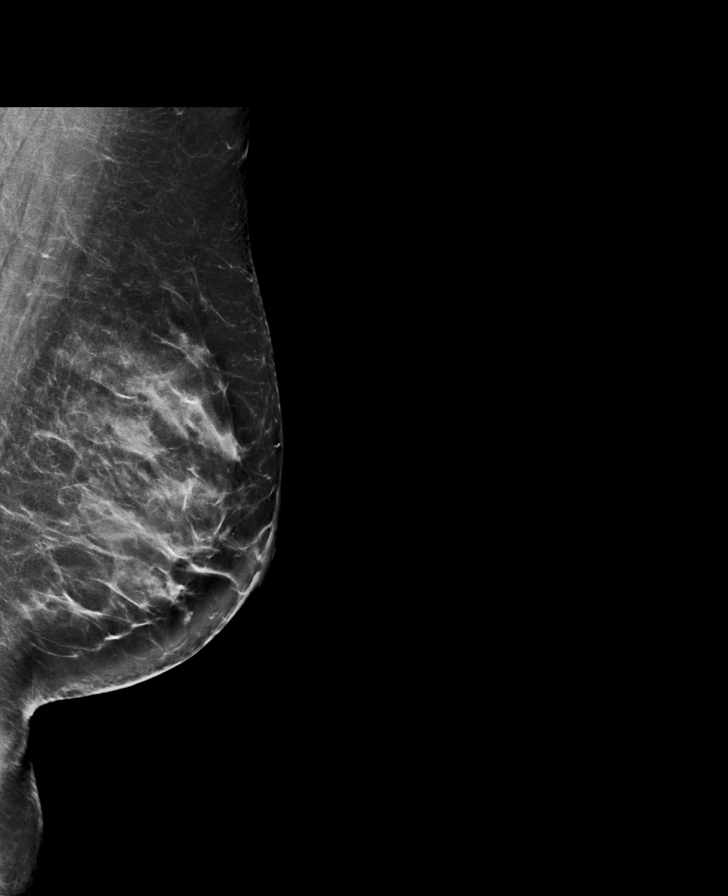

[R CC tomo · tomo slice 43/86.0]
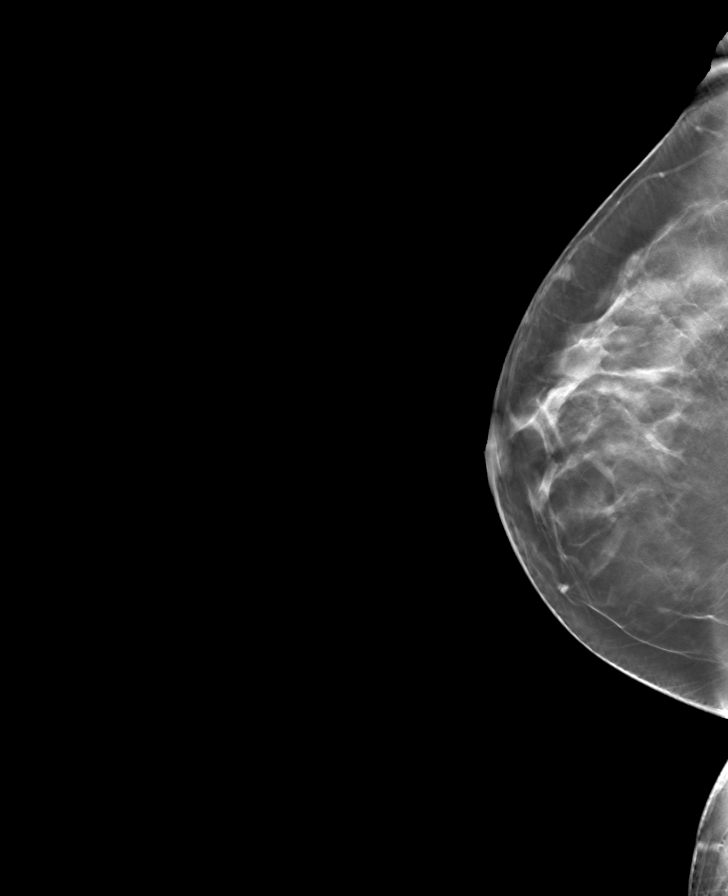

[L MLO tomo · tomo slice 47/92.0]
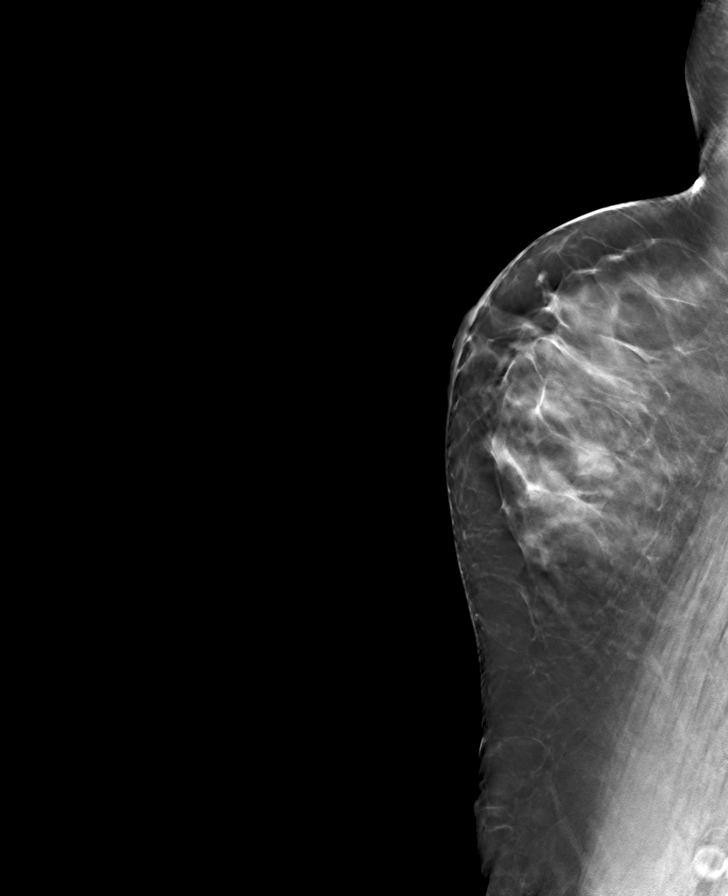

[L CC tomo · tomo slice 43/85.0]
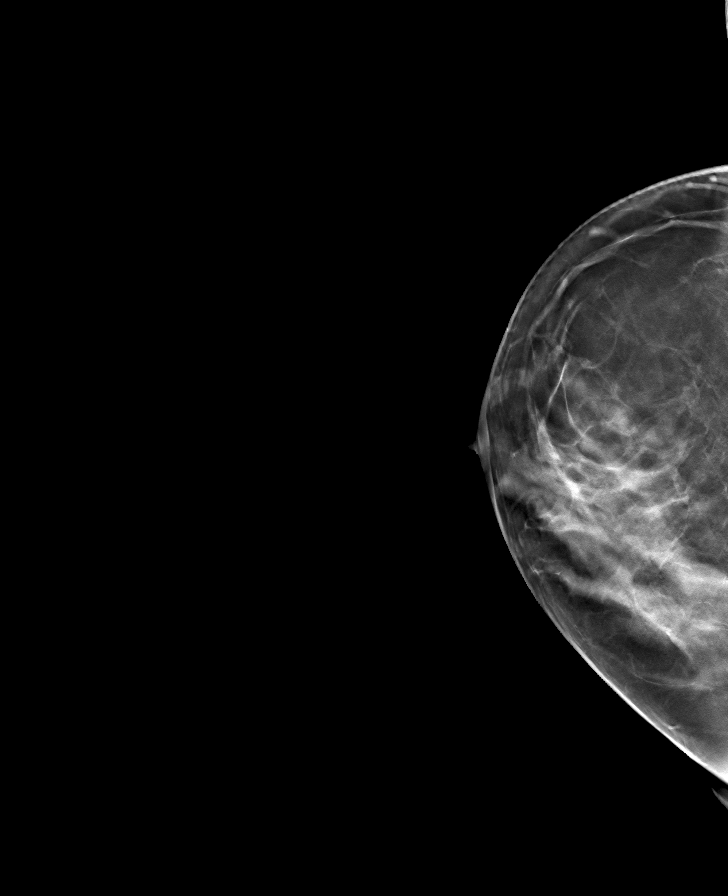

[R MLO tomo · tomo slice 45/90.0]
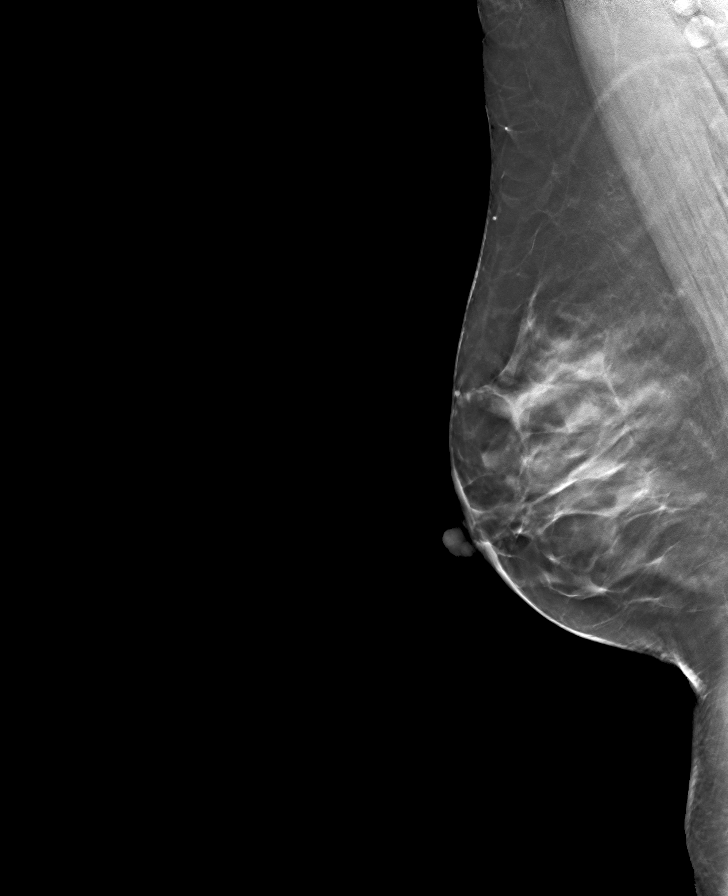

[8 of 24 positions shown; findings below may reference images not displayed]

ACR Breast Density Category c: The breast tissue is heterogeneously
dense, which may obscure small masses.
FINDINGS: There are no findings suspicious for malignancy.
IMPRESSION: No mammographic evidence of malignancy. A result letter of this
screening mammogram will be mailed directly to the patient.

RECOMMENDATION:
Screening mammogram in one year. (Code:Q3-W-BC3)

BI-RADS CATEGORY  1: Negative.

## 2023-06-05 ENCOUNTER — Other Ambulatory Visit: Payer: Self-pay | Admitting: Family Medicine

## 2023-06-05 DIAGNOSIS — Z1231 Encounter for screening mammogram for malignant neoplasm of breast: Secondary | ICD-10-CM

## 2023-06-20 ENCOUNTER — Other Ambulatory Visit: Payer: Self-pay

## 2023-06-20 ENCOUNTER — Ambulatory Visit: Payer: BC Managed Care – PPO | Admitting: Gastroenterology

## 2023-06-20 ENCOUNTER — Encounter: Payer: Self-pay | Admitting: Gastroenterology

## 2023-06-20 VITALS — BP 123/85 | HR 75 | Temp 98.2°F | Ht 62.0 in | Wt 141.1 lb

## 2023-06-20 DIAGNOSIS — R1031 Right lower quadrant pain: Secondary | ICD-10-CM | POA: Diagnosis not present

## 2023-06-20 DIAGNOSIS — N939 Abnormal uterine and vaginal bleeding, unspecified: Secondary | ICD-10-CM | POA: Insufficient documentation

## 2023-06-20 DIAGNOSIS — Z1211 Encounter for screening for malignant neoplasm of colon: Secondary | ICD-10-CM

## 2023-06-20 DIAGNOSIS — G8929 Other chronic pain: Secondary | ICD-10-CM

## 2023-06-20 NOTE — Progress Notes (Signed)
Arlyss Repress, MD [address]  [address]  [place], [place] [address]  Main: [phone]  Fax: [phone]    Gastroenterology Consultation  Referring Provider:     Alm Bustard, NP Primary Care Physician:  Alm Bustard, NP Primary Gastroenterologist:  Dr. Arlyss Repress Reason for Consultation: Right lower quadrant pain        HPI:   Phyllis Torres is a 47 y.o. female referred by  Alm Bustard, NP  for consultation & management of chronic symptoms of right lower quadrant discomfort, dull in nature, mild to moderate intensity, worse during bowel movement.  She reports having smoothies relieves her discomfort.  She denies any abdominal bloating, constipation or diarrhea or rectal bleeding.  Pain has started since she had hysterectomy in July 2023.  She denies any weight loss or any other symptoms.  No history of anemia.  She denies any family history of GI malignancy, IBD  NSAIDs: None  Antiplts/Anticoagulants/Anti thrombotics: None  GI Procedures: None none  No past medical history on file.  Past Surgical History:  Procedure Laterality Date   BILATERAL SALPINGECTOMY Bilateral 04/06/2022   Procedure: BILATERAL SALPINGECTOMY WITH RIGHT OVARIAN CYSTOTOMY;  Surgeon: Schermerhorn, Ihor Austin, MD;  Location: [facility] ORS;  Service: Gynecology;  Laterality: Bilateral;   VAGINAL HYSTERECTOMY N/A 04/06/2022   Procedure: HYSTERECTOMY VAGINAL;  Surgeon: Schermerhorn, Ihor Austin, MD;  Location: [facility] ORS;  Service: Gynecology;  Laterality: N/A;     Current Outpatient Medications:    clobetasol (TEMOVATE) 0.05 % external solution, Apply topically., Disp: , Rfl:    etonogestrel (NEXPLANON) 68 MG IMPL implant, 1 each by Subdermal route once., Disp: , Rfl:    ibuprofen (ADVIL) 600 MG tablet, Take 600 mg by mouth every 6 (six) hours as needed., Disp: , Rfl:    No family history on file.   Social History   Tobacco Use   Smoking status: Never   Smokeless  tobacco: Never  Vaping Use   Vaping status: Never Used  Substance Use Topics   Alcohol use: Yes    Alcohol/week: 1.0 - 2.0 standard drink of alcohol    Types: 1 - 2 Cans of beer per week    Comment: occassionally   Drug use: Never    Allergies as of 06/20/2023 - Review Complete 06/20/2023  Allergen Reaction Noted   Phentermine Other (See Comments) and Nausea Only 01/09/2022    Review of Systems:    All systems reviewed and negative except where noted in HPI.   Physical Exam:  BP 123/85 (BP Location: Left Arm, Patient Position: Sitting, Cuff Size: Normal)   Pulse 75   Temp 98.2 F (36.8 C) (Oral)   Ht 5\' 2"  (1.575 m)   Wt 141 lb 2 oz (64 kg)   BMI 25.81 kg/m  No LMP recorded.  General:   Alert,  Well-developed, well-nourished, pleasant and cooperative in NAD Head:  Normocephalic and atraumatic. Eyes:  Sclera clear, no icterus.   Conjunctiva pink. Ears:  Normal auditory acuity. Nose:  No deformity, discharge, or lesions. Mouth:  No deformity or lesions,oropharynx pink & moist. Neck:  Supple; no masses or thyromegaly. Lungs:  Respirations even and unlabored.  Clear throughout to auscultation.   No wheezes, crackles, or rhonchi. No acute distress. Heart:  Regular rate and rhythm; no murmurs, clicks, rubs, or gallops. Abdomen:  Normal bowel sounds. Soft, mild tenderness in right lower quadrant, and non-distended without masses, hepatosplenomegaly or hernias noted.  No guarding or  rebound tenderness.   Rectal: Not performed Msk:  Symmetrical without gross deformities. Good, equal movement & strength bilaterally. Pulses:  Normal pulses noted. Extremities:  No clubbing or edema.  No cyanosis. Neurologic:  Alert and oriented x3;  grossly normal neurologically. Skin:  Intact without significant lesions or rashes. No jaundice. Psych:  Alert and cooperative. Normal mood and affect.  Imaging Studies: No abdominal imaging  Assessment and Plan:   [name] is a  47 y.o. female with no signet past medical history, history of vaginal hysterectomy in July 2023 seen in consultation for chronic right lower quadrant discomfort. ?If her discomfort is secondary to scar tissue post surgery  Recommend screening colonoscopy with possible TI evaluation  I have discussed alternative options, risks & benefits,  which include, but are not limited to, bleeding, infection, perforation,respiratory complication & drug reaction.  The patient agrees with this plan & written consent will be obtained.    Follow up based on the colonoscopy results   Arlyss Repress, MD

## 2023-06-20 NOTE — Patient Instructions (Signed)
Call [phone] to schedule colonoscopy with Morrie Sheldon Dr. Allegra Lai nurse

## 2023-06-24 ENCOUNTER — Other Ambulatory Visit: Payer: Self-pay

## 2023-06-24 ENCOUNTER — Telehealth: Payer: Self-pay

## 2023-06-24 DIAGNOSIS — Z1211 Encounter for screening for malignant neoplasm of colon: Secondary | ICD-10-CM

## 2023-06-24 MED ORDER — NA SULFATE-K SULFATE-MG SULF 17.5-3.13-1.6 GM/177ML PO SOLN: 354.0000 mL | Freq: Once | ORAL | 0 refills | Status: AC

## 2023-06-24 NOTE — Telephone Encounter (Signed)
Patient called and left two voicemail's on Friday to schedule her colonoscopy on my voicemail. Return patient call and got patient schedule for 07/09/2023 in [place]. Went over instructions, mailed them and sent prep to the pharmacy

## 2023-07-01 ENCOUNTER — Encounter: Payer: Self-pay | Admitting: Gastroenterology

## 2023-07-09 ENCOUNTER — Other Ambulatory Visit: Payer: Self-pay

## 2023-07-09 ENCOUNTER — Encounter: Payer: Self-pay | Admitting: Gastroenterology

## 2023-07-09 ENCOUNTER — Encounter
Admission: RE | Disposition: A | Discharge status: Home / Self Care | Payer: Self-pay | Source: Home / Self Care | Attending: Gastroenterology

## 2023-07-09 ENCOUNTER — Ambulatory Visit: Payer: BC Managed Care – PPO | Admitting: Anesthesiology

## 2023-07-09 ENCOUNTER — Ambulatory Visit
Admission: RE | Admit: 2023-07-09 | Discharge: 2023-07-09 | Disposition: A | Discharge status: Home / Self Care | Payer: BC Managed Care – PPO | Attending: Gastroenterology | Admitting: Gastroenterology

## 2023-07-09 DIAGNOSIS — Z1211 Encounter for screening for malignant neoplasm of colon: Secondary | ICD-10-CM | POA: Diagnosis present

## 2023-07-09 HISTORY — PX: COLONOSCOPY WITH PROPOFOL: SHX5780

## 2023-07-09 HISTORY — DX: Presence of dental prosthetic device (complete) (partial): Z97.2

## 2023-07-09 SURGERY — COLONOSCOPY WITH PROPOFOL
Anesthesia: General

## 2023-07-09 MED ORDER — SODIUM CHLORIDE 0.9% FLUSH: 10.0000 mL | INTRAVENOUS | Status: DC | PRN

## 2023-07-09 MED ORDER — STERILE WATER FOR IRRIGATION IR SOLN
Status: DC | PRN
  Administered 2023-07-09: 60 mL

## 2023-07-09 MED ORDER — SODIUM CHLORIDE 0.9 % IV SOLN: INTRAVENOUS | Status: DC

## 2023-07-09 MED ORDER — SODIUM CHLORIDE 0.9% FLUSH
10.0000 mL | Freq: Two times a day (BID) | INTRAVENOUS | Status: DC
Start: 2023-07-09 — End: 2023-07-09

## 2023-07-09 MED ORDER — HYOSCYAMINE SULFATE 0.125 MG PO TABS: 0.1250 mg | ORAL_TABLET | ORAL | 0 refills | Status: AC | PRN

## 2023-07-09 MED ORDER — STERILE WATER FOR IRRIGATION IR SOLN
Status: DC | PRN
  Administered 2023-07-09: 1

## 2023-07-09 MED ORDER — PROPOFOL 10 MG/ML IV BOLUS
INTRAVENOUS | Status: DC | PRN
  Administered 2023-07-09: 50 mg via INTRAVENOUS
  Administered 2023-07-09 (×2): 25 mg via INTRAVENOUS
  Administered 2023-07-09: 100 mg via INTRAVENOUS
  Administered 2023-07-09: 50 mg via INTRAVENOUS

## 2023-07-09 SURGICAL SUPPLY — 6 items
GOWN CVR UNV OPN BCK APRN NK (MISCELLANEOUS) ×2 IMPLANT
GOWN ISOL THUMB LOOP REG UNIV (MISCELLANEOUS) ×2
KIT PRC NS LF DISP ENDO (KITS) ×1 IMPLANT
KIT PROCEDURE OLYMPUS (KITS) ×1
MANIFOLD NEPTUNE II (INSTRUMENTS) ×1 IMPLANT
WATER STERILE IRR 250ML POUR (IV SOLUTION) ×1 IMPLANT

## 2023-07-09 NOTE — Anesthesia Postprocedure Evaluation (Signed)
Anesthesia Post Note  Patient: Phyllis Torres  Procedure(s) Performed: COLONOSCOPY WITH PROPOFOL  Patient location during evaluation: PACU Anesthesia Type: General Level of consciousness: awake and alert Pain management: pain level controlled Vital Signs Assessment: post-procedure vital signs reviewed and stable Respiratory status: spontaneous breathing, nonlabored ventilation, respiratory function stable and patient connected to nasal cannula oxygen Cardiovascular status: stable and blood pressure returned to baseline Postop Assessment: no apparent nausea or vomiting Anesthetic complications: no  No notable events documented.   Last Vitals:  Vitals:   07/09/23 0925 07/09/23 0930  BP: 96/65 (!) 105/58  Pulse: 72 75  Resp: 16 13  Temp:    SpO2: 99% 98%    Last Pain:  Vitals:   07/09/23 0930  TempSrc:   PainSc: 0-No pain                 Stephanie Coup

## 2023-07-09 NOTE — Op Note (Signed)
[facility] Gastroenterology Patient Name: Phyllis Torres Procedure Date: 07/09/2023 8:45 AM MRN: [id number] Account #: [id number] Date of Birth: 1976/04/13 Admit Type: Outpatient Age: 47 Room: [facility] OR ROOM 01 Gender: Female Note Status: Finalized Instrument Name: [id number] Procedure:             Colonoscopy Indications:           Screening for colorectal malignant neoplasm, This is                         the patient's first colonoscopy Providers:             Toney Reil MD, MD Referring MD:          Toney Reil MD, MD (Referring MD), Lisabeth Pick.                         Fields (Referring MD) Medicines:             General Anesthesia Complications:         No immediate complications. Estimated blood loss: None. Procedure:             Pre-Anesthesia Assessment:                        - Prior to the procedure, a History and Physical was                         performed, and patient medications and allergies were                         reviewed. The patient is competent. The risks and                         benefits of the procedure and the sedation options and                         risks were discussed with the patient. All questions                         were answered and informed consent was obtained.                         Patient identification and proposed procedure were                         verified by the physician, the nurse, the                         anesthesiologist, the anesthetist and the technician                         in the pre-procedure area in the procedure room in the                         endoscopy suite. Mental Status Examination: alert and                         oriented. Airway Examination: normal oropharyngeal  airway and neck mobility. Respiratory Examination:                         clear to auscultation. CV Examination: normal.                         Prophylactic  Antibiotics: The patient does not require                         prophylactic antibiotics. Prior Anticoagulants: The                         patient has taken no anticoagulant or antiplatelet                         agents. ASA Grade Assessment: I - A normal, healthy                         patient. After reviewing the risks and benefits, the                         patient was deemed in satisfactory condition to                         undergo the procedure. The anesthesia plan was to use                         general anesthesia. Immediately prior to                         administration of medications, the patient was                         re-assessed for adequacy to receive sedatives. The                         heart rate, respiratory rate, oxygen saturations,                         blood pressure, adequacy of pulmonary ventilation, and                         response to care were monitored throughout the                         procedure. The physical status of the patient was                         re-assessed after the procedure.                        After obtaining informed consent, the colonoscope was                         passed under direct vision. Throughout the procedure,                         the patient's blood pressure, pulse, and oxygen  saturations were monitored continuously. The                         Colonoscope was introduced through the anus and                         advanced to the the terminal ileum, with                         identification of the appendiceal orifice and IC                         valve. The colonoscopy was performed without                         difficulty. The patient tolerated the procedure well.                         The quality of the bowel preparation was evaluated                         using the BBPS [facility] Bowel Preparation Scale) with                         scores of: Right Colon = 3,  Transverse Colon = 3 and                         Left Colon = 3 (entire mucosa seen well with no                         residual staining, small fragments of stool or opaque                         liquid). The total BBPS score equals 9. The terminal                         ileum, ileocecal valve, appendiceal orifice, and                         rectum were photographed. Findings:      The perianal and digital rectal examinations were normal. Pertinent       negatives include normal sphincter tone and no palpable rectal lesions.      The terminal ileum appeared normal.      The entire examined colon appeared normal.      The retroflexed view of the distal rectum and anal verge was normal and       showed no anal or rectal abnormalities. Impression:            - The examined portion of the ileum was normal.                        - The entire examined colon is normal.                        - The distal rectum and anal verge are normal on  retroflexion view.                        - No specimens collected. Recommendation:        - Discharge patient to home (with escort).                        - Resume previous diet today.                        - Continue present medications.                        - Repeat colonoscopy in 10 years for screening                         purposes. Procedure Code(s):     --- Professional ---                        Q6578, Colorectal cancer screening; colonoscopy on                         individual not meeting criteria for high risk Diagnosis Code(s):     --- Professional ---                        Z12.11, Encounter for screening for malignant neoplasm                         of colon CPT copyright 2022 American Medical Association. All rights reserved. The codes documented in this report are preliminary and upon coder review may  be revised to meet current compliance requirements. Dr. Libby Maw Toney Reil MD,  MD 07/09/2023 9:16:52 AM This report has been signed electronically. Number of Addenda: 0 Note Initiated On: 07/09/2023 8:45 AM Scope Withdrawal Time: 0 hours 11 minutes 51 seconds  Total Procedure Duration: 0 hours 16 minutes 18 seconds  Estimated Blood Loss:  Estimated blood loss: none.      [facility]

## 2023-07-09 NOTE — Anesthesia Preprocedure Evaluation (Signed)
Anesthesia Evaluation  Patient identified by MRN, date of birth, ID band Patient awake    Reviewed: Allergy & Precautions, NPO status , Patient's Chart, lab work & pertinent test results  Airway Mallampati: III  TM Distance: >3 FB Neck ROM: full    Dental  (+) Chipped   Pulmonary neg pulmonary ROS   Pulmonary exam normal        Cardiovascular negative cardio ROS Normal cardiovascular exam     Neuro/Psych negative neurological ROS  negative psych ROS   GI/Hepatic negative GI ROS, Neg liver ROS,,,  Endo/Other  negative endocrine ROS    Renal/GU negative Renal ROS  negative genitourinary   Musculoskeletal   Abdominal   Peds  Hematology negative hematology ROS (+)   Anesthesia Other Findings Past Medical History: No date: Wears dentures     Comment:  Full upper, partial lower  Past Surgical History: 04/06/2022: BILATERAL SALPINGECTOMY; Bilateral     Comment:  Procedure: BILATERAL SALPINGECTOMY WITH RIGHT OVARIAN               CYSTOTOMY;  Surgeon: Schermerhorn, Ihor Austin, MD;                Location: [facility] ORS;  Service: Gynecology;  Laterality:               Bilateral; 04/06/2022: VAGINAL HYSTERECTOMY; N/A     Comment:  Procedure: HYSTERECTOMY VAGINAL;  Surgeon: Suzy Bouchard, MD;  Location: [facility] ORS;  Service: Gynecology;               Laterality: N/A;  BMI    Body Mass Index: 25.24 kg/m      Reproductive/Obstetrics negative OB ROS                             Anesthesia Physical Anesthesia Plan  ASA: 1  Anesthesia Plan: General   Post-op Pain Management:    Induction: Intravenous  PONV Risk Score and Plan: TIVA and Propofol infusion  Airway Management Planned: Natural Airway and Nasal Cannula  Additional Equipment:   Intra-op Plan:   Post-operative Plan:   Informed Consent: I have reviewed the patients History and Physical, chart, labs and  discussed the procedure including the risks, benefits and alternatives for the proposed anesthesia with the patient or authorized representative who has indicated his/her understanding and acceptance.     Dental Advisory Given  Plan Discussed with: Anesthesiologist, CRNA and Surgeon  Anesthesia Plan Comments: (Patient consented for risks of anesthesia including but not limited to:  - adverse reactions to medications - risk of airway placement if required - damage to eyes, teeth, lips or other oral mucosa - nerve damage due to positioning  - sore throat or hoarseness - Damage to heart, brain, nerves, lungs, other parts of body or loss of life  Patient voiced understanding and assent.)       Anesthesia Quick Evaluation

## 2023-07-09 NOTE — H&P (Signed)
Arlyss Repress, MD [address]  [address]  [place], [place] [address]  Main: [phone]  Fax: [phone] Pager: [phone]  Primary Care Physician:  Alm Bustard, NP Primary Gastroenterologist:  Dr. Arlyss Repress  Pre-Procedure History & Physical: HPI:  Phyllis Torres is a 47 y.o. female is here for an colonoscopy.   Past Medical History:  Diagnosis Date   Wears dentures    Full upper, partial lower    Past Surgical History:  Procedure Laterality Date   BILATERAL SALPINGECTOMY Bilateral 04/06/2022   Procedure: BILATERAL SALPINGECTOMY WITH RIGHT OVARIAN CYSTOTOMY;  Surgeon: Schermerhorn, Ihor Austin, MD;  Location: [facility] ORS;  Service: Gynecology;  Laterality: Bilateral;   VAGINAL HYSTERECTOMY N/A 04/06/2022   Procedure: HYSTERECTOMY VAGINAL;  Surgeon: Schermerhorn, Ihor Austin, MD;  Location: [facility] ORS;  Service: Gynecology;  Laterality: N/A;    Prior to Admission medications   Medication Sig Start Date End Date Taking? Authorizing Provider  clobetasol (TEMOVATE) 0.05 % external solution Apply topically. 06/04/23  Yes [provider]  MAGNESIUM PO Take by mouth daily.   Yes [provider]  ibuprofen (ADVIL) 600 MG tablet Take 600 mg by mouth every 6 (six) hours as needed. Patient not taking: Reported on 07/01/2023 05/09/23   [provider]    Allergies as of 06/24/2023 - Review Complete 06/20/2023  Allergen Reaction Noted   Phentermine Other (See Comments) and Nausea Only 01/09/2022    History reviewed. No pertinent family history.  Social History   Socioeconomic History   Marital status: Single    Spouse name: Not on file   Number of children: Not on file   Years of education: Not on file   Highest education level: Not on file  Occupational History   Not on file  Tobacco Use   Smoking status: Never   Smokeless tobacco: Never  Vaping Use   Vaping status: Never Used  Substance and Sexual Activity   Alcohol use:  Yes    Alcohol/week: 1.0 - 2.0 standard drink of alcohol    Types: 1 - 2 Cans of beer per week    Comment: occassionally   Drug use: Never   Sexual activity: Not on file  Other Topics Concern   Not on file  Social History Narrative   Not on file   Social Determinants of Health   Financial Resource Strain: Not on file  Food Insecurity: Not on file  Transportation Needs: Not on file  Physical Activity: Not on file  Stress: Not on file  Social Connections: Not on file  Intimate Partner Violence: Not on file    Review of Systems: See HPI, otherwise negative ROS  Physical Exam: BP 126/76   Pulse 77   Temp 97.9 F (36.6 C) (Temporal)   Resp 16   Ht 5\' 2"  (1.575 m)   Wt 62.6 kg   LMP 03/07/2022 (Approximate) Comment: Negative test 04/06/2022  SpO2 99%   BMI 25.24 kg/m  General:   Alert,  pleasant and cooperative in NAD Head:  Normocephalic and atraumatic. Neck:  Supple; no masses or thyromegaly. Lungs:  Clear throughout to auscultation.    Heart:  Regular rate and rhythm. Abdomen:  Soft, nontender and nondistended. Normal bowel sounds, without guarding, and without rebound.   Neurologic:  Alert and  oriented x4;  grossly normal neurologically.  Impression/Plan: Phyllis Torres is here for an colonoscopy to be performed for colon cancer screening  Risks, benefits, limitations, and alternatives regarding  colonoscopy have been  reviewed with the patient.  Questions have been answered.  All parties agreeable.   Lannette Donath, MD  07/09/2023, 8:15 AM

## 2023-07-09 NOTE — Transfer of Care (Signed)
Immediate Anesthesia Transfer of Care Note  Patient: Phyllis Torres  Procedure(s) Performed: COLONOSCOPY WITH PROPOFOL  Patient Location: PACU  Anesthesia Type: General  Level of Consciousness: awake, alert  and patient cooperative  Airway and Oxygen Therapy: Patient Spontanous Breathing and Patient connected to supplemental oxygen  Post-op Assessment: Post-op Vital signs reviewed, Patient's Cardiovascular Status Stable, Respiratory Function Stable, Patent Airway and No signs of Nausea or vomiting  Post-op Vital Signs: Reviewed and stable  Complications: No notable events documented.

## 2023-07-10 ENCOUNTER — Encounter: Payer: Self-pay | Admitting: Gastroenterology

## 2024-05-14 ENCOUNTER — Other Ambulatory Visit: Payer: Self-pay | Admitting: Family Medicine

## 2024-05-14 DIAGNOSIS — Z1231 Encounter for screening mammogram for malignant neoplasm of breast: Secondary | ICD-10-CM

## 2024-06-15 ENCOUNTER — Inpatient Hospital Stay: Admission: RE | Admit: 2024-06-15 | Source: Ambulatory Visit
